# Patient Record
Sex: Male | Born: 1965 | Race: Black or African American | Hispanic: No | Marital: Married | Smoking: Current every day smoker
Health system: Southern US, Community
[De-identification: ages and names within clinical notes are randomized; demographics above are authoritative.]

---

## 2016-03-04 ENCOUNTER — Emergency Department: Payer: No Typology Code available for payment source

## 2016-03-04 ENCOUNTER — Encounter: Payer: Self-pay | Admitting: Emergency Medicine

## 2016-03-04 ENCOUNTER — Emergency Department
Admission: EM | Admit: 2016-03-04 | Discharge: 2016-03-05 | Disposition: A | Discharge status: Home / Self Care | Payer: No Typology Code available for payment source | Attending: Emergency Medicine | Admitting: Emergency Medicine

## 2016-03-04 DIAGNOSIS — R0789 Other chest pain: Secondary | ICD-10-CM | POA: Insufficient documentation

## 2016-03-04 DIAGNOSIS — F17210 Nicotine dependence, cigarettes, uncomplicated: Secondary | ICD-10-CM | POA: Diagnosis not present

## 2016-03-04 DIAGNOSIS — Y92410 Unspecified street and highway as the place of occurrence of the external cause: Secondary | ICD-10-CM | POA: Diagnosis not present

## 2016-03-04 DIAGNOSIS — Y9389 Activity, other specified: Secondary | ICD-10-CM | POA: Insufficient documentation

## 2016-03-04 DIAGNOSIS — S8002XA Contusion of left knee, initial encounter: Secondary | ICD-10-CM | POA: Diagnosis not present

## 2016-03-04 DIAGNOSIS — S63512A Sprain of carpal joint of left wrist, initial encounter: Secondary | ICD-10-CM | POA: Insufficient documentation

## 2016-03-04 DIAGNOSIS — S20212A Contusion of left front wall of thorax, initial encounter: Secondary | ICD-10-CM | POA: Diagnosis not present

## 2016-03-04 DIAGNOSIS — Y999 Unspecified external cause status: Secondary | ICD-10-CM | POA: Diagnosis not present

## 2016-03-04 DIAGNOSIS — S6992XA Unspecified injury of left wrist, hand and finger(s), initial encounter: Secondary | ICD-10-CM | POA: Diagnosis present

## 2016-03-04 DIAGNOSIS — R52 Pain, unspecified: Secondary | ICD-10-CM

## 2016-03-04 LAB — COMPREHENSIVE METABOLIC PANEL
ALBUMIN: 3.9 g/dL (ref 3.5–5.0)
ALT: 24 U/L (ref 17–63)
AST: 32 U/L (ref 15–41)
Alkaline Phosphatase: 52 U/L (ref 38–126)
Anion gap: 6 (ref 5–15)
BUN: 14 mg/dL (ref 6–20)
CHLORIDE: 108 mmol/L (ref 101–111)
CO2: 25 mmol/L (ref 22–32)
CREATININE: 1.26 mg/dL — AB (ref 0.61–1.24)
Calcium: 8.8 mg/dL — ABNORMAL LOW (ref 8.9–10.3)
GFR calc Af Amer: >60 mL/min (ref >60)
GLUCOSE: 99 mg/dL (ref 65–99)
POTASSIUM: 3.5 mmol/L (ref 3.5–5.1)
Sodium: 139 mmol/L (ref 135–145)
Total Bilirubin: 0.4 mg/dL (ref 0.3–1.2)
Total Protein: 6.3 g/dL — ABNORMAL LOW (ref 6.5–8.1)

## 2016-03-04 LAB — CBC WITH DIFFERENTIAL/PLATELET
BASOS ABS: 0.1 10*3/uL (ref 0–0.1)
BASOS PCT: 2 %
EOS PCT: 1 %
Eosinophils Absolute: 0.1 10*3/uL (ref 0–0.7)
HCT: 43.4 % (ref 40.0–52.0)
Hemoglobin: 15.5 g/dL (ref 13.0–18.0)
LYMPHS PCT: 27 %
Lymphs Abs: 2.6 10*3/uL (ref 1.0–3.6)
MCH: 34 pg (ref 26.0–34.0)
MCHC: 35.6 g/dL (ref 32.0–36.0)
MCV: 95.4 fL (ref 80.0–100.0)
Monocytes Absolute: 0.8 10*3/uL (ref 0.2–1.0)
Monocytes Relative: 8 %
NEUTROS ABS: 6.1 10*3/uL (ref 1.4–6.5)
Neutrophils Relative %: 62 %
PLATELETS: 249 10*3/uL (ref 150–440)
RBC: 4.55 MIL/uL (ref 4.40–5.90)
RDW: 13.2 % (ref 11.5–14.5)
WBC: 9.7 10*3/uL (ref 3.8–10.6)

## 2016-03-04 LAB — TROPONIN I

## 2016-03-04 MED ORDER — HYDROMORPHONE HCL 1 MG/ML IJ SOLN
0.5000 mg | Freq: Once | INTRAMUSCULAR | Status: AC
  Administered 2016-03-04: 0.5 mg via INTRAVENOUS
  Filled 2016-03-04: qty 1

## 2016-03-04 MED ORDER — IOPAMIDOL (ISOVUE-300) INJECTION 61%
75.0000 mL | Freq: Once | INTRAVENOUS | Status: AC | PRN
  Administered 2016-03-04: 75 mL via INTRAVENOUS

## 2016-03-04 MED ORDER — DIAZEPAM 5 MG/ML IJ SOLN
5.0000 mg | Freq: Once | INTRAMUSCULAR | Status: AC
  Administered 2016-03-04: 5 mg via INTRAVENOUS
  Filled 2016-03-04: qty 2
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
## 2016-03-05 MED ORDER — OXYCODONE-ACETAMINOPHEN 5-325 MG PO TABS: 1.0000 | ORAL_TABLET | Freq: Four times a day (QID) | ORAL | 0 refills | Status: AC | PRN

## 2016-03-05 MED ORDER — OXYCODONE-ACETAMINOPHEN 5-325 MG PO TABS
1.0000 | ORAL_TABLET | Freq: Once | ORAL | Status: AC
  Administered 2016-03-05: 1 via ORAL
  Filled 2016-03-05: qty 1

## 2016-03-05 NOTE — ED Provider Notes (Signed)
Assumed care from Dr. Mayford KnifeWilliams pending imaging results. Shows chest contusion. X-ray of the left wrist does show what looks like scapholunate ligamentous injury. Patient does have pain in this area. It's thumb spica splint was applied, refer to orthopedics. Percocet. All results were discussed with the patient, questions answered.  SPLINT APPLICATION Date/Time: 2:15 AM Authorized by: Sharman CheekSTAFFORD, Shafin Pollio Consent: Verbal consent obtained. Risks and benefits: risks, benefits and alternatives were discussed Consent given by: patient Splint applied by: orthopedic technician Location details: L wrist Splint type: thumb spica Supplies used: orthoglass Post-procedure: The splinted body part was neurovascularly unchanged following the procedure. Patient tolerance: Patient tolerated the procedure well with no immediate complications.      Sharman CheekPhillip Tasha Diaz, MD 03/05/16 [phone]

## 2016-03-05 NOTE — ED Notes (Signed)
Discharge instructions reviewed with patient. Patient verbalized understanding. Patient taken to lobby via wheelchair to wait for taxi

## 2016-03-05 NOTE — Discharge Instructions (Signed)
Your x-ray shows a possible injury to your wrist involving the scapholunate ligament. We splinted this to protect the wrist. Please follow-up with orthopedics tomorrow for further evaluation.
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
